# Patient Record
Sex: Male | Born: 1985 | Race: White | Hispanic: No | Marital: Single | Smoking: Current every day smoker
Health system: Southern US, Community
[De-identification: ages and names within clinical notes are randomized; demographics above are authoritative.]

## PROBLEM LIST (undated history)

## (undated) DIAGNOSIS — T300 Burn of unspecified body region, unspecified degree: Secondary | ICD-10-CM

## (undated) DIAGNOSIS — F1120 Opioid dependence, uncomplicated: Secondary | ICD-10-CM

## (undated) HISTORY — PX: SKIN GRAFT: SHX250

---

## 2011-08-17 ENCOUNTER — Encounter (HOSPITAL_COMMUNITY): Payer: Self-pay | Admitting: *Deleted

## 2011-08-17 ENCOUNTER — Emergency Department (HOSPITAL_COMMUNITY)
Admission: EM | Admit: 2011-08-17 | Discharge: 2011-08-17 | Discharge status: Against Medical Advice | Payer: Self-pay | Attending: Emergency Medicine | Admitting: Emergency Medicine

## 2011-08-17 DIAGNOSIS — F17200 Nicotine dependence, unspecified, uncomplicated: Secondary | ICD-10-CM | POA: Insufficient documentation

## 2011-08-17 DIAGNOSIS — Z046 Encounter for general psychiatric examination, requested by authority: Secondary | ICD-10-CM | POA: Insufficient documentation

## 2011-08-17 DIAGNOSIS — F1910 Other psychoactive substance abuse, uncomplicated: Secondary | ICD-10-CM | POA: Insufficient documentation

## 2011-08-17 LAB — COMPREHENSIVE METABOLIC PANEL
ALT: 19 U/L (ref 0–53)
Albumin: 3.6 g/dL (ref 3.5–5.2)
Alkaline Phosphatase: 61 U/L (ref 39–117)
Potassium: 3.7 mEq/L (ref 3.5–5.1)
Sodium: 138 mEq/L (ref 135–145)
Total Protein: 7.1 g/dL (ref 6.0–8.3)

## 2011-08-17 LAB — CBC WITH DIFFERENTIAL/PLATELET
Basophils Absolute: 0.1 10*3/uL (ref 0.0–0.1)
Eosinophils Absolute: 0.3 10*3/uL (ref 0.0–0.7)
HCT: 40.9 % (ref 39.0–52.0)
Lymphs Abs: 3.5 10*3/uL (ref 0.7–4.0)
MCH: 29.5 pg (ref 26.0–34.0)
MCHC: 34 g/dL (ref 30.0–36.0)
MCV: 86.8 fL (ref 78.0–100.0)
Monocytes Absolute: 0.8 10*3/uL (ref 0.1–1.0)
Neutro Abs: 3.1 10*3/uL (ref 1.7–7.7)
RDW: 13 % (ref 11.5–15.5)

## 2011-08-17 LAB — RAPID URINE DRUG SCREEN, HOSP PERFORMED
Barbiturates: POSITIVE — AB
Benzodiazepines: NOT DETECTED

## 2011-08-17 MED ORDER — NICOTINE 21 MG/24HR TD PT24
21.0000 mg | MEDICATED_PATCH | Freq: Every day | TRANSDERMAL | Status: DC
  Administered 2011-08-17: 21 mg via TRANSDERMAL
  Filled 2011-08-17: qty 1

## 2011-08-17 MED ORDER — ACETAMINOPHEN 325 MG PO TABS: 650.0000 mg | ORAL_TABLET | ORAL | Status: DC | PRN

## 2011-08-17 MED ORDER — CLONIDINE HCL 0.1 MG PO TABS: 0.1000 mg | ORAL_TABLET | Freq: Two times a day (BID) | ORAL | Status: DC | PRN

## 2011-08-17 MED ORDER — IBUPROFEN 400 MG PO TABS: 600.0000 mg | ORAL_TABLET | Freq: Three times a day (TID) | ORAL | Status: DC | PRN

## 2011-08-17 MED ORDER — ALUM & MAG HYDROXIDE-SIMETH 200-200-20 MG/5ML PO SUSP: 30.0000 mL | ORAL | Status: DC | PRN

## 2011-08-17 MED ORDER — ONDANSETRON HCL 8 MG PO TABS: 4.0000 mg | ORAL_TABLET | Freq: Three times a day (TID) | ORAL | Status: DC | PRN

## 2011-08-17 MED ORDER — LORAZEPAM 1 MG PO TABS: 1.0000 mg | ORAL_TABLET | Freq: Three times a day (TID) | ORAL | Status: DC | PRN

## 2011-08-17 NOTE — ED Provider Notes (Signed)
History     CSN: [id number]  Arrival date & time 08/17/11  4098   First MD Initiated Contact with Patient 08/17/11 [phone]      Chief Complaint  Patient presents with  . Medical Clearance    (Consider location/radiation/quality/duration/timing/severity/associated sxs/prior treatment) HPI History provided by patient. Here requesting detox from crack cocaine and heroin. He denies any alcohol use. He has been using cocaine for a long time but only recently started injecting heroin. No fevers chills. No arm pain or swelling. No suicidal or homicidal ideations. No hallucinations. Patient denies been through detox in the past and states he wants to stop drugs, feels like he hangs out with the wrong crowd.  History reviewed. No pertinent past medical history.  History reviewed. No pertinent past surgical history.  No family history on file.  History  Substance Use Topics  . Smoking status: Current Everyday Smoker -- 0.5 packs/day    Types: Cigarettes  . Smokeless tobacco: Not on file  . Alcohol Use: No      Review of Systems  Constitutional: Negative for fever and chills.  HENT: Negative for neck pain and neck stiffness.   Eyes: Negative for pain.  Respiratory: Negative for shortness of breath.   Cardiovascular: Negative for chest pain.  Gastrointestinal: Negative for abdominal pain.  Genitourinary: Negative for dysuria.  Musculoskeletal: Negative for back pain.  Skin: Negative for rash.  Neurological: Negative for headaches.  Psychiatric/Behavioral: Negative for suicidal ideas and self-injury.  All other systems reviewed and are negative.    Allergies  Codeine  Home Medications  No current outpatient prescriptions on file.  BP 116/51  Pulse 93  Temp 98.2 F (36.8 C) (Oral)  Resp 15  SpO2 97%  Physical Exam  Constitutional: He is oriented to person, place, and time. He appears well-developed and well-nourished.  HENT:  Head: Normocephalic and atraumatic.    Eyes: Conjunctivae and EOM are normal. Pupils are equal, round, and reactive to light.  Neck: Trachea normal. Neck supple. No thyromegaly present.  Cardiovascular: Normal rate, regular rhythm, S1 normal, S2 normal and normal pulses.     No systolic murmur is present   No diastolic murmur is present  Pulses:      Radial pulses are 2+ on the right side, and 2+ on the left side.  Pulmonary/Chest: Effort normal and breath sounds normal. He has no wheezes. He has no rhonchi. He has no rales. He exhibits no tenderness.  Abdominal: Soft. Normal appearance and bowel sounds are normal. There is no tenderness. There is no CVA tenderness and negative Murphy's sign.  Musculoskeletal: Normal range of motion. He exhibits no edema and no tenderness.       Normal gait  Neurological: He is alert and oriented to person, place, and time. He has normal strength. No cranial nerve deficit or sensory deficit. GCS eye subscore is 4. GCS verbal subscore is 5. GCS motor subscore is 6.  Skin: Skin is warm and dry. No rash noted. He is not diaphoretic.  Psychiatric: His speech is normal.       Cooperative and appropriate    ED Course  Procedures (including critical care time)  Results for orders placed during the hospital encounter of 08/17/11  COMPREHENSIVE METABOLIC PANEL      Component Value Range   Sodium 138  135 - 145 mEq/L   Potassium 3.7  3.5 - 5.1 mEq/L   Chloride 101  96 - 112 mEq/L   CO2 28  19 -  32 mEq/L   Glucose, Bld 114 (*) 70 - 99 mg/dL   BUN 12  6 - 23 mg/dL   Creatinine, Ser 1.61  0.50 - 1.35 mg/dL   Calcium 9.4  8.4 - 09.6 mg/dL   Total Protein 7.1  6.0 - 8.3 g/dL   Albumin 3.6  3.5 - 5.2 g/dL   AST 21  0 - 37 U/L   ALT 19  0 - 53 U/L   Alkaline Phosphatase 61  39 - 117 U/L   Total Bilirubin 0.1 (*) 0.3 - 1.2 mg/dL   GFR calc non Af Amer >90  >90 mL/min   GFR calc Af Amer >90  >90 mL/min  CBC WITH DIFFERENTIAL      Component Value Range   WBC 7.8  4.0 - 10.5 K/uL   RBC 4.71  4.22  - 5.81 MIL/uL   Hemoglobin 13.9  13.0 - 17.0 g/dL   HCT 04.5  40.9 - 81.1 %   MCV 86.8  78.0 - 100.0 fL   MCH 29.5  26.0 - 34.0 pg   MCHC 34.0  30.0 - 36.0 g/dL   RDW 91.4  78.2 - 95.6 %   Platelets 227  150 - 400 K/uL   Neutrophils Relative 40 (*) 43 - 77 %   Lymphocytes Relative 45  12 - 46 %   Monocytes Relative 10  3 - 12 %   Eosinophils Relative 4  0 - 5 %   Basophils Relative 1  0 - 1 %   Neutro Abs 3.1  1.7 - 7.7 K/uL   Lymphs Abs 3.5  0.7 - 4.0 K/uL   Monocytes Absolute 0.8  0.1 - 1.0 K/uL   Eosinophils Absolute 0.3  0.0 - 0.7 K/uL   Basophils Absolute 0.1  0.0 - 0.1 K/uL  ETHANOL      Component Value Range   Alcohol, Ethyl (B) <11  0 - 11 mg/dL   Case discussed with ACT team - will evaluate in ED for possible detox.   No indication for IVC or psychiatric admission. Vital signs in normal range  MDM   Polysubstance abuse requesting detox.        Sunnie Nielsen, MD 08/17/11 [phone]

## 2011-08-17 NOTE — ED Notes (Signed)
Patient requested to leave hospital AMA.

## 2011-08-17 NOTE — BH Assessment (Signed)
[facility] Assessment Progress Note      1145:  Screening at [facility] completed.  Faxed over vitals and labwork for review per request to [phone]. Awaiting disposition by [facility].

## 2011-08-17 NOTE — ED Notes (Signed)
Here for help with drug detox. Reports crack, cocaine & heroin use. Denies ETOH. Denies physical sx. Last use last night. Alert, NAD, calm, interactive.

## 2011-08-17 NOTE — ED Notes (Signed)
Irving Burton with ACT Team was made aware about the consult.

## 2011-08-17 NOTE — BH Assessment (Signed)
Assessment Note   Hunter Perry is an 26 y.o. male that presented to the ED requesting detox from Crack and Heroin.  Pt reports binging on Crack, up to 20 dollars QD for a month +.  He is also abusing Heroin and uses approximately 20 dollars QD for a month + as well.  Pt has not slept in over a day and is lethargic and groggy, but admits using until last night with no sleep and has lost almost 30 pounds in the last month as well.  Pt denies any prior treatment for substance abuse or mental health issues.  Pt currently resides with his mother, but it is questionable if he is able to return there.  Pt has 2 legal charges related to driving with a revoked license.  Pt denies SI, HI, or any active psychotic features.  Pt is able to contract for safety and will be referred to [facility] and [facility] for possible admission.    Axis I: Substance Abuse Axis II: Deferred Axis III: History reviewed. No pertinent past medical history. Axis IV: other psychosocial or environmental problems, problems related to legal system/crime, problems related to social environment and problems with primary support group Axis V: 31-40 impairment in reality testing  Past Medical History: History reviewed. No pertinent past medical history.  History reviewed. No pertinent past surgical history.  Family History: No family history on file.  Social History:  reports that he has been smoking Cigarettes.  He has been smoking about .5 packs per day. He does not have any smokeless tobacco history on file. He reports that he uses illicit drugs (IV and Cocaine). He reports that he does not drink alcohol.  Additional Social History:  Alcohol / Drug Use Pain Medications: No Prescriptions: No Over the Counter: No History of alcohol / drug use?: Yes Substance #1 Name of Substance 1: Crack 1 - Age of First Use: 17 1 - Amount (size/oz): 20 dollars 1 - Frequency: QD 1 - Duration: 1 month + 1 - Last Use / Amount: last night- 06/27 Substance  #2 Name of Substance 2: Heroin 2 - Age of First Use: 25` 2 - Amount (size/oz): 20 dollars  2 - Frequency: QD 2 - Duration: 1 month + 2 - Last Use / Amount: last night 06/27  CIWA: CIWA-Ar BP: 116/51 mmHg Pulse Rate: 93  Nausea and Vomiting: no nausea and no vomiting Tactile Disturbances: none Tremor: no tremor Auditory Disturbances: not present Paroxysmal Sweats: no sweat visible Visual Disturbances: not present Anxiety: mildly anxious Headache, Fullness in Head: none present Agitation: normal activity Orientation and Clouding of Sensorium: oriented and can do serial additions CIWA-Ar Total: 1  COWS: Clinical Opiate Withdrawal Scale (COWS) Resting Pulse Rate: Pulse Rate 81-100 Sweating: Subjective report of chills or flushing Restlessness: Able to sit still Pupil Size: Pupils possibly larger than normal for room light Bone or Joint Aches: Not present Runny Nose or Tearing: Not present GI Upset: No GI symptoms Tremor: Tremor can be felt, but not observed Yawning: No yawning Anxiety or Irritability: Patient reports increasing irritability or anxiousness Gooseflesh Skin: Skin is smooth COWS Total Score: 5   Allergies:  Allergies  Allergen Reactions  . Codeine Hives and Rash    Home Medications:  (Not in a hospital admission)  OB/GYN Status:  No LMP for male patient.  General Assessment Data Location of Assessment: [facility] ED Living Arrangements: Parent Can pt return to current living arrangement?: Yes Admission Status: Voluntary Is patient capable of signing voluntary  admission?: Yes Transfer from: Acute Hospital Referral Source: MD  Education Status Is patient currently in school?: No  Risk to self Suicidal Ideation: No Suicidal Intent: No Is patient at risk for suicide?: No Suicidal Plan?: No Access to Means: No What has been your use of drugs/alcohol within the last 12 months?: Cocaine and Heroin Previous Attempts/Gestures: No How many times?: 0  Other  Self Harm Risks: damaging, reckless Intentional Self Injurious Behavior: Damaging Family Suicide History: No Recent stressful life event(s): Legal Issues;Conflict (Comment) Persecutory voices/beliefs?: No Depression: No Substance abuse history and/or treatment for substance abuse?: Yes Suicide prevention information given to non-admitted patients: Not applicable  Risk to Others Homicidal Ideation: No Thoughts of Harm to Others: No Current Homicidal Intent: No Current Homicidal Plan: No Access to Homicidal Means: No Identified Victim: N/A History of harm to others?: No Assessment of Violence: None Noted Violent Behavior Description: N/A Does patient have access to weapons?: No Criminal Charges Pending?: Yes Describe Pending Criminal Charges: Driving While License Revoked Does patient have a court date: Yes Court Date: 08/29/11  Psychosis Hallucinations: None noted Delusions: None noted  Mental Status Report Appear/Hygiene: Disheveled;Body odor Eye Contact: Poor Motor Activity: Unremarkable Speech: Soft;Slow;Slurred Level of Consciousness: Drowsy;Irritable Mood: Labile;Irritable Affect: Anxious Anxiety Level: Minimal Thought Processes: Relevant Judgement: Unimpaired Orientation: Person;Place;Time;Situation Obsessive Compulsive Thoughts/Behaviors: Moderate  Cognitive Functioning Concentration: Decreased Memory: Recent Impaired;Remote Impaired IQ: Average Insight: Poor Impulse Control: Poor Appetite: Poor Weight Loss: 30  Weight Gain: 0  Sleep: Decreased Total Hours of Sleep: 3  Vegetative Symptoms: Decreased grooming;Not bathing  ADLScreening [facility] Assessment Services) Patient's cognitive ability adequate to safely complete daily activities?: Yes Patient able to express need for assistance with ADLs?: Yes Independently performs ADLs?: Yes  Abuse/Neglect [facility]) Physical Abuse: Denies Verbal Abuse: Denies Sexual Abuse: Denies  Prior Inpatient Therapy Prior  Inpatient Therapy: No Prior Therapy Dates: n/a Prior Therapy Facilty/Provider(s): n/a Reason for Treatment: n/a  Prior Outpatient Therapy Prior Outpatient Therapy: No Prior Therapy Dates: n/a Prior Therapy Facilty/Provider(s): n/a Reason for Treatment: n/a  ADL Screening (condition at time of admission) Patient's cognitive ability adequate to safely complete daily activities?: Yes Patient able to express need for assistance with ADLs?: Yes Independently performs ADLs?: Yes Weakness of Legs: None Weakness of Arms/Hands: None       Abuse/Neglect Assessment (Assessment to be complete while patient is alone) Physical Abuse: Denies Verbal Abuse: Denies Sexual Abuse: Denies Exploitation of patient/patient's resources: Denies Self-Neglect: Yes, present (Comment) (Pt reports not sleeping, eating, or grooming as he should ) Values / Beliefs Cultural Requests During Hospitalization: None Spiritual Requests During Hospitalization: None   Advance Directives (For Healthcare) Advance Directive: Patient does not have advance directive    Additional Information 1:1 In Past 12 Months?: No CIRT Risk: No Elopement Risk: No Does patient have medical clearance?: Yes     Disposition:  Disposition Disposition of Patient: Referred to Patient referred to: [facility]  On Site Evaluation by:   Reviewed with Physician:     Angelica Ran 08/17/2011 8:25 AM

## 2011-08-17 NOTE — ED Notes (Signed)
Breakfast tray ordered 

## 2011-08-17 NOTE — ED Notes (Signed)
Patient left AMA after signing AMA paper work.

## 2011-11-06 ENCOUNTER — Emergency Department (HOSPITAL_COMMUNITY)
Admission: EM | Admit: 2011-11-06 | Discharge: 2011-11-07 | Disposition: A | Discharge status: Home / Self Care | Payer: Self-pay | Attending: Emergency Medicine | Admitting: Emergency Medicine

## 2011-11-06 ENCOUNTER — Encounter (HOSPITAL_COMMUNITY): Payer: Self-pay

## 2011-11-06 DIAGNOSIS — F1110 Opioid abuse, uncomplicated: Secondary | ICD-10-CM | POA: Insufficient documentation

## 2011-11-06 DIAGNOSIS — F17200 Nicotine dependence, unspecified, uncomplicated: Secondary | ICD-10-CM | POA: Insufficient documentation

## 2011-11-06 HISTORY — DX: Burn of unspecified body region, unspecified degree: T30.0

## 2011-11-06 HISTORY — DX: Opioid dependence, uncomplicated: F11.20

## 2011-11-06 LAB — CBC
HCT: 45 % (ref 39.0–52.0)
Hemoglobin: 15.4 g/dL (ref 13.0–17.0)
MCH: 29.3 pg (ref 26.0–34.0)
MCHC: 34.2 g/dL (ref 30.0–36.0)
MCV: 85.6 fL (ref 78.0–100.0)
Platelets: 252 10*3/uL (ref 150–400)
RBC: 5.26 MIL/uL (ref 4.22–5.81)
RDW: 14.2 % (ref 11.5–15.5)
WBC: 9.3 10*3/uL (ref 4.0–10.5)

## 2011-11-06 LAB — COMPREHENSIVE METABOLIC PANEL
ALT: 1135 U/L — ABNORMAL HIGH (ref 0–53)
AST: 420 U/L — ABNORMAL HIGH (ref 0–37)
Albumin: 3.4 g/dL — ABNORMAL LOW (ref 3.5–5.2)
Alkaline Phosphatase: 107 U/L (ref 39–117)
BUN: 8 mg/dL (ref 6–23)
CO2: 24 mEq/L (ref 19–32)
Calcium: 9 mg/dL (ref 8.4–10.5)
Chloride: 102 mEq/L (ref 96–112)
Creatinine, Ser: 0.86 mg/dL (ref 0.50–1.35)
GFR calc Af Amer: >90 mL/min (ref >90)
GFR calc non Af Amer: >90 mL/min (ref >90)
Glucose, Bld: 100 mg/dL — ABNORMAL HIGH (ref 70–99)
Potassium: 3.8 mEq/L (ref 3.5–5.1)
Sodium: 136 mEq/L (ref 135–145)
Total Bilirubin: 0.8 mg/dL (ref 0.3–1.2)
Total Protein: 6.7 g/dL (ref 6.0–8.3)

## 2011-11-06 LAB — RAPID URINE DRUG SCREEN, HOSP PERFORMED
Amphetamines: NOT DETECTED
Barbiturates: POSITIVE — AB
Benzodiazepines: NOT DETECTED
Cocaine: POSITIVE — AB
Opiates: POSITIVE — AB
Tetrahydrocannabinol: NOT DETECTED

## 2011-11-06 LAB — ETHANOL: Alcohol, Ethyl (B): <11 mg/dL (ref 0–11)

## 2011-11-06 MED ORDER — NAPROXEN 500 MG PO TABS: 500.0000 mg | ORAL_TABLET | Freq: Two times a day (BID) | ORAL | Status: DC | PRN

## 2011-11-06 MED ORDER — IBUPROFEN 200 MG PO TABS: 600.0000 mg | ORAL_TABLET | Freq: Three times a day (TID) | ORAL | Status: DC | PRN

## 2011-11-06 MED ORDER — LORAZEPAM 1 MG PO TABS
1.0000 mg | ORAL_TABLET | Freq: Three times a day (TID) | ORAL | Status: DC | PRN
  Administered 2011-11-06 – 2011-11-07 (×2): 1 mg via ORAL
  Filled 2011-11-06 (×2): qty 1

## 2011-11-06 MED ORDER — ONDANSETRON 4 MG PO TBDP: 4.0000 mg | ORAL_TABLET | Freq: Four times a day (QID) | ORAL | Status: DC | PRN

## 2011-11-06 MED ORDER — CLONIDINE HCL 0.1 MG PO TABS: 0.1000 mg | ORAL_TABLET | ORAL | Status: DC

## 2011-11-06 MED ORDER — CLONIDINE HCL 0.1 MG PO TABS: 0.1000 mg | ORAL_TABLET | Freq: Every day | ORAL | Status: DC

## 2011-11-06 MED ORDER — METHOCARBAMOL 500 MG PO TABS
500.0000 mg | ORAL_TABLET | Freq: Three times a day (TID) | ORAL | Status: DC | PRN
  Administered 2011-11-06 – 2011-11-07 (×2): 500 mg via ORAL
  Filled 2011-11-06 (×2): qty 1

## 2011-11-06 MED ORDER — HYDROXYZINE HCL 25 MG PO TABS
25.0000 mg | ORAL_TABLET | Freq: Four times a day (QID) | ORAL | Status: DC | PRN
  Administered 2011-11-07: 25 mg via ORAL
  Filled 2011-11-06: qty 1

## 2011-11-06 MED ORDER — ALUM & MAG HYDROXIDE-SIMETH 200-200-20 MG/5ML PO SUSP: 30.0000 mL | ORAL | Status: DC | PRN

## 2011-11-06 MED ORDER — CLONIDINE HCL 0.1 MG PO TABS: 0.1000 mg | ORAL_TABLET | Freq: Four times a day (QID) | ORAL | Status: DC

## 2011-11-06 MED ORDER — ONDANSETRON HCL 4 MG PO TABS: 4.0000 mg | ORAL_TABLET | Freq: Three times a day (TID) | ORAL | Status: DC | PRN

## 2011-11-06 MED ORDER — LOPERAMIDE HCL 2 MG PO CAPS: 2.0000 mg | ORAL_CAPSULE | ORAL | Status: DC | PRN

## 2011-11-06 MED ORDER — ZOLPIDEM TARTRATE 5 MG PO TABS: 5.0000 mg | ORAL_TABLET | Freq: Every evening | ORAL | Status: DC | PRN

## 2011-11-06 MED ORDER — DICYCLOMINE HCL 20 MG PO TABS: 20.0000 mg | ORAL_TABLET | Freq: Four times a day (QID) | ORAL | Status: DC | PRN

## 2011-11-06 MED ORDER — ACETAMINOPHEN 325 MG PO TABS: 650.0000 mg | ORAL_TABLET | ORAL | Status: DC | PRN
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
## 2011-11-06 NOTE — ED Notes (Signed)
Pt in room making loud noises, into pt room, lying on bed with legs "jerking" stating, can't you give me something for my legs?"; pt informed will check to see what is available, pt verbalized understanding.

## 2011-11-06 NOTE — ED Notes (Signed)
Pt presents with no acute distress- Requesting detox from [facility]

## 2011-11-06 NOTE — ED Notes (Addendum)
Pt states "I tried to detox back in June off heroin but they weren't helping me"; when questioned who, pt stated "[place]"; pt also states my stomach and legs hurt, last used yesterday, been using since last December"; informed pt would check to see what is available, pt verbalized understanding.

## 2011-11-07 ENCOUNTER — Encounter (HOSPITAL_COMMUNITY): Payer: Self-pay | Admitting: *Deleted

## 2011-11-07 NOTE — B[facility] Counselor (Signed)
Beatrix Shipper, assessment counselor at [facility], submitted Pt for admission to [facility]. Akeysha McMurren, AC confirmed bed availability. Gave clinical report to Verne Spurr, PA who said [facility] physicians will need to review Pt's clinical criteria in more detail due to Pt's elevated liver enzymes. Communicated this information to [employer].  Harlin Rain Patsy [place], LPC

## 2011-11-07 NOTE — ED Provider Notes (Addendum)
Currently sleeping and without complaints. Awaiting placement for heroin detox.  Hunter Booze, MD 11/07/11 [phone]  Patient is now stating that he does not wish to stay because we are not doing anything for him. I reviewed his medications and he is not on a clonidine withdrawal protocol. I have informed patient of this and told him that I would be glad to put him on the clonidine withdrawal  protocol, but he states he does not want to stay in the does not want to go through detox. He is not homicidal or suicidal he is not hallucinating. I see no indication for keeping him in an involuntary basis, so he will be discharged with instructions to follow up with outpatient services.  Hunter Booze, MD 11/07/11 [phone]

## 2011-11-07 NOTE — ED Notes (Signed)
ACT team in to consult
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
## 2020-09-10 ENCOUNTER — Emergency Department (HOSPITAL_COMMUNITY): Payer: Self-pay

## 2020-09-10 ENCOUNTER — Encounter (HOSPITAL_COMMUNITY): Payer: Self-pay | Admitting: Pharmacy Technician

## 2020-09-10 ENCOUNTER — Inpatient Hospital Stay (HOSPITAL_COMMUNITY)
Admission: EM | Admit: 2020-09-10 | Discharge: 2020-09-12 | DRG: 142 | Disposition: A | Discharge status: Home / Self Care | Payer: Self-pay | Attending: Surgery | Admitting: Surgery

## 2020-09-10 DIAGNOSIS — S0993XA Unspecified injury of face, initial encounter: Secondary | ICD-10-CM | POA: Diagnosis present

## 2020-09-10 DIAGNOSIS — S0266XB Fracture of symphysis of mandible, initial encounter for open fracture: Secondary | ICD-10-CM | POA: Diagnosis present

## 2020-09-10 DIAGNOSIS — T07XXXA Unspecified multiple injuries, initial encounter: Secondary | ICD-10-CM

## 2020-09-10 DIAGNOSIS — S02602B Fracture of unspecified part of body of left mandible, initial encounter for open fracture: Principal | ICD-10-CM | POA: Diagnosis present

## 2020-09-10 DIAGNOSIS — T148XXA Other injury of unspecified body region, initial encounter: Secondary | ICD-10-CM | POA: Diagnosis present

## 2020-09-10 DIAGNOSIS — S02641A Fracture of ramus of right mandible, initial encounter for closed fracture: Secondary | ICD-10-CM | POA: Diagnosis present

## 2020-09-10 DIAGNOSIS — Z885 Allergy status to narcotic agent status: Secondary | ICD-10-CM

## 2020-09-10 DIAGNOSIS — S0083XA Contusion of other part of head, initial encounter: Secondary | ICD-10-CM | POA: Diagnosis present

## 2020-09-10 DIAGNOSIS — S02609B Fracture of mandible, unspecified, initial encounter for open fracture: Secondary | ICD-10-CM

## 2020-09-10 DIAGNOSIS — S31821A Laceration without foreign body of left buttock, initial encounter: Secondary | ICD-10-CM | POA: Diagnosis present

## 2020-09-10 DIAGNOSIS — Z20822 Contact with and (suspected) exposure to covid-19: Secondary | ICD-10-CM | POA: Diagnosis present

## 2020-09-10 DIAGNOSIS — F17210 Nicotine dependence, cigarettes, uncomplicated: Secondary | ICD-10-CM | POA: Diagnosis present

## 2020-09-10 LAB — HIV ANTIBODY (ROUTINE TESTING W REFLEX): HIV Screen 4th Generation wRfx: NONREACTIVE

## 2020-09-10 LAB — PROTIME-INR
INR: 1.1 (ref 0.8–1.2)
Prothrombin Time: 14.3 seconds (ref 11.4–15.2)

## 2020-09-10 LAB — COMPREHENSIVE METABOLIC PANEL
ALT: 88 U/L — ABNORMAL HIGH (ref 0–44)
AST: 65 U/L — ABNORMAL HIGH (ref 15–41)
Albumin: 3.3 g/dL — ABNORMAL LOW (ref 3.5–5.0)
Alkaline Phosphatase: 51 U/L (ref 38–126)
Anion gap: 4 — ABNORMAL LOW (ref 5–15)
BUN: 9 mg/dL (ref 6–20)
CO2: 25 mmol/L (ref 22–32)
Calcium: 8.1 mg/dL — ABNORMAL LOW (ref 8.9–10.3)
Chloride: 107 mmol/L (ref 98–111)
Creatinine, Ser: 0.87 mg/dL (ref 0.61–1.24)
GFR, Estimated: >60 mL/min (ref >60)
Glucose, Bld: 103 mg/dL — ABNORMAL HIGH (ref 70–99)
Potassium: 3.7 mmol/L (ref 3.5–5.1)
Sodium: 136 mmol/L (ref 135–145)
Total Bilirubin: 0.9 mg/dL (ref 0.3–1.2)
Total Protein: 6.2 g/dL — ABNORMAL LOW (ref 6.5–8.1)

## 2020-09-10 LAB — URINALYSIS, ROUTINE W REFLEX MICROSCOPIC
Bilirubin Urine: NEGATIVE
Glucose, UA: NEGATIVE mg/dL
Hgb urine dipstick: NEGATIVE
Ketones, ur: 5 mg/dL — AB
Leukocytes,Ua: NEGATIVE
Nitrite: NEGATIVE
Protein, ur: NEGATIVE mg/dL
Specific Gravity, Urine: 1.043 — ABNORMAL HIGH (ref 1.005–1.030)
pH: 6 (ref 5.0–8.0)

## 2020-09-10 LAB — I-STAT CHEM 8, ED
BUN: 9 mg/dL (ref 6–20)
Calcium, Ion: 1.07 mmol/L — ABNORMAL LOW (ref 1.15–1.40)
Chloride: 106 mmol/L (ref 98–111)
Creatinine, Ser: 0.8 mg/dL (ref 0.61–1.24)
Glucose, Bld: 99 mg/dL (ref 70–99)
HCT: 45 % (ref 39.0–52.0)
Hemoglobin: 15.3 g/dL (ref 13.0–17.0)
Potassium: 3.6 mmol/L (ref 3.5–5.1)
Sodium: 140 mmol/L (ref 135–145)
TCO2: 22 mmol/L (ref 22–32)

## 2020-09-10 LAB — RESP PANEL BY RT-PCR (FLU A&B, COVID) ARPGX2
Influenza A by PCR: NEGATIVE
Influenza B by PCR: NEGATIVE
SARS Coronavirus 2 by RT PCR: NEGATIVE

## 2020-09-10 LAB — CBC
HCT: 44.2 % (ref 39.0–52.0)
Hemoglobin: 14.6 g/dL (ref 13.0–17.0)
MCH: 30.5 pg (ref 26.0–34.0)
MCHC: 33 g/dL (ref 30.0–36.0)
MCV: 92.5 fL (ref 80.0–100.0)
Platelets: 247 10*3/uL (ref 150–400)
RBC: 4.78 MIL/uL (ref 4.22–5.81)
RDW: 13 % (ref 11.5–15.5)
WBC: 20.6 10*3/uL — ABNORMAL HIGH (ref 4.0–10.5)
nRBC: 0 % (ref 0.0–0.2)

## 2020-09-10 LAB — SAMPLE TO BLOOD BANK

## 2020-09-10 LAB — LACTIC ACID, PLASMA: Lactic Acid, Venous: 1 mmol/L (ref 0.5–1.9)

## 2020-09-10 LAB — ETHANOL: Alcohol, Ethyl (B): <10 mg/dL (ref <10)

## 2020-09-10 MED ORDER — CHLORHEXIDINE GLUCONATE 0.12 % MT SOLN
15.0000 mL | Freq: Two times a day (BID) | OROMUCOSAL | Status: DC
  Administered 2020-09-10 (×2): 15 mL via OROMUCOSAL
  Filled 2020-09-10 (×3): qty 15

## 2020-09-10 MED ORDER — ONDANSETRON HCL 4 MG/2ML IJ SOLN
4.0000 mg | INTRAMUSCULAR | Status: DC | PRN
  Administered 2020-09-10: 4 mg via INTRAVENOUS
  Filled 2020-09-10: qty 2

## 2020-09-10 MED ORDER — METHOCARBAMOL 1000 MG/10ML IJ SOLN
500.0000 mg | Freq: Four times a day (QID) | INTRAVENOUS | Status: DC | PRN
  Filled 2020-09-10: qty 5

## 2020-09-10 MED ORDER — SODIUM CHLORIDE 0.9 % IV SOLN
3.0000 g | Freq: Three times a day (TID) | INTRAVENOUS | Status: DC
  Administered 2020-09-10 – 2020-09-12 (×6): 3 g via INTRAVENOUS
  Filled 2020-09-10: qty 3
  Filled 2020-09-10: qty 8
  Filled 2020-09-10: qty 3
  Filled 2020-09-10 (×9): qty 8

## 2020-09-10 MED ORDER — KETOROLAC TROMETHAMINE 15 MG/ML IJ SOLN
15.0000 mg | Freq: Three times a day (TID) | INTRAMUSCULAR | Status: DC
  Administered 2020-09-10 – 2020-09-12 (×5): 15 mg via INTRAVENOUS
  Filled 2020-09-10 (×4): qty 1

## 2020-09-10 MED ORDER — ACETAMINOPHEN 325 MG PO TABS
650.0000 mg | ORAL_TABLET | Freq: Four times a day (QID) | ORAL | Status: DC
  Administered 2020-09-10 – 2020-09-11 (×3): 650 mg via ORAL
  Filled 2020-09-10 (×3): qty 2

## 2020-09-10 MED ORDER — FENTANYL CITRATE (PF) 100 MCG/2ML IJ SOLN
50.0000 ug | INTRAMUSCULAR | Status: DC | PRN
  Administered 2020-09-10 (×2): 50 ug via INTRAVENOUS
  Filled 2020-09-10 (×2): qty 2

## 2020-09-10 MED ORDER — CEFAZOLIN SODIUM-DEXTROSE 2-4 GM/100ML-% IV SOLN
2.0000 g | Freq: Once | INTRAVENOUS | Status: AC
  Administered 2020-09-10: 2 g via INTRAVENOUS
  Filled 2020-09-10: qty 100

## 2020-09-10 MED ORDER — LIDOCAINE-EPINEPHRINE 1 %-1:100000 IJ SOLN
20.0000 mL | Freq: Once | INTRAMUSCULAR | Status: AC
  Administered 2020-09-10: 1 mL
  Filled 2020-09-10: qty 1

## 2020-09-10 MED ORDER — ONDANSETRON HCL 4 MG/2ML IJ SOLN: 4.0000 mg | Freq: Four times a day (QID) | INTRAMUSCULAR | Status: DC | PRN

## 2020-09-10 MED ORDER — ENOXAPARIN SODIUM 30 MG/0.3ML IJ SOSY: 30.0000 mg | PREFILLED_SYRINGE | Freq: Two times a day (BID) | INTRAMUSCULAR | Status: DC

## 2020-09-10 MED ORDER — LACTATED RINGERS IV SOLN: INTRAVENOUS | Status: DC

## 2020-09-10 MED ORDER — OXYCODONE HCL 5 MG PO TABS: 5.0000 mg | ORAL_TABLET | ORAL | Status: DC | PRN

## 2020-09-10 MED ORDER — SODIUM CHLORIDE 0.9 % IV SOLN: 3.0000 g | INTRAVENOUS | Status: DC

## 2020-09-10 MED ORDER — HYDROMORPHONE HCL 1 MG/ML IJ SOLN: 0.5000 mg | INTRAMUSCULAR | Status: DC | PRN

## 2020-09-10 MED ORDER — ONDANSETRON 4 MG PO TBDP: 4.0000 mg | ORAL_TABLET | Freq: Four times a day (QID) | ORAL | Status: DC | PRN

## 2020-09-10 MED ORDER — IOHEXOL 300 MG/ML  SOLN
100.0000 mL | Freq: Once | INTRAMUSCULAR | Status: AC | PRN
  Administered 2020-09-10: 100 mL via INTRAVENOUS

## 2020-09-10 MED ORDER — OXYCODONE HCL 5 MG PO TABS
10.0000 mg | ORAL_TABLET | ORAL | Status: DC | PRN
Start: 2020-09-10 — End: 2020-09-12
  Administered 2020-09-10 – 2020-09-11 (×2): 10 mg via ORAL
  Filled 2020-09-10 (×2): qty 2

## 2020-09-10 MED ORDER — GABAPENTIN 300 MG PO CAPS
300.0000 mg | ORAL_CAPSULE | Freq: Three times a day (TID) | ORAL | Status: DC
  Administered 2020-09-10 (×2): 300 mg via ORAL
  Filled 2020-09-10 (×3): qty 1

## 2020-09-10 NOTE — ED Notes (Signed)
Family at bedside. 

## 2020-09-10 NOTE — ED Provider Notes (Signed)
Laceration to L hip repaired by me per request from Dr. Donnald Garre.   Marland Kitchen.Laceration Repair  Date/Time: 09/10/2020 2:54 PM Performed by: Fayrene Helper, PA-C Authorized by: Fayrene Helper, PA-C   Consent:    Consent obtained:  Verbal   Consent given by:  Patient and parent   Risks discussed:  Infection, need for additional repair, pain, poor cosmetic result and poor wound healing   Alternatives discussed:  No treatment and delayed treatment Universal protocol:    Procedure explained and questions answered to patient or proxy's satisfaction: yes     Relevant documents present and verified: yes     Test results available: yes     Imaging studies available: yes     Required blood products, implants, devices, and special equipment available: yes     Site/side marked: yes     Immediately prior to procedure, a time out was called: yes     Patient identity confirmed:  Verbally with patient Anesthesia:    Anesthesia method:  Local infiltration   Local anesthetic:  Lidocaine 2% WITH epi Laceration details:    Location:  Pelvis   Pelvis location:  L hip   Length (cm):  3   Depth (mm):  12 Pre-procedure details:    Preparation:  Patient was prepped and draped in usual sterile fashion and imaging obtained to evaluate for foreign bodies Exploration:    Limited defect created (wound extended): no     Hemostasis achieved with:  Direct pressure   Imaging outcome: foreign body not noted     Wound extent: muscle damage and vascular damage     Contaminated: no   Treatment:    Area cleansed with:  Saline   Amount of cleaning:  Standard   Irrigation solution:  Sterile saline   Irrigation method:  Pressure wash Skin repair:    Repair method:  Staples   Number of staples:  4 Approximation:    Approximation:  Loose Repair type:    Repair type:  Simple Post-procedure details:    Dressing:  Non-adherent dressing   Procedure completion:  Tolerated with difficulty    Fayrene Helper, PA-C 09/10/20  [id number]    Arby Barrette, MD 09/15/20 [phone]

## 2020-09-10 NOTE — ED Notes (Signed)
Pt refusing covid swab
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
## 2020-09-10 NOTE — ED Notes (Signed)
Attempted report 

## 2020-09-10 NOTE — ED Notes (Signed)
Patient transported to CT 

## 2020-09-10 NOTE — ED Notes (Signed)
Blood draw unsuccessful 
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
## 2020-09-11 ENCOUNTER — Encounter (HOSPITAL_COMMUNITY): Payer: Self-pay

## 2020-09-11 ENCOUNTER — Observation Stay (HOSPITAL_COMMUNITY): Payer: Self-pay | Admitting: Certified Registered Nurse Anesthetist

## 2020-09-11 ENCOUNTER — Encounter (HOSPITAL_COMMUNITY): Admission: EM | Disposition: A | Discharge status: Home / Self Care | Payer: Self-pay | Source: Home / Self Care

## 2020-09-11 DIAGNOSIS — S0993XA Unspecified injury of face, initial encounter: Secondary | ICD-10-CM | POA: Diagnosis present

## 2020-09-11 HISTORY — PX: MINOR REMOVAL OF MANDIBULAR HARDWARE: SHX6427

## 2020-09-11 LAB — CBC
HCT: 33.6 % — ABNORMAL LOW (ref 39.0–52.0)
Hemoglobin: 11.5 g/dL — ABNORMAL LOW (ref 13.0–17.0)
MCH: 31.1 pg (ref 26.0–34.0)
MCHC: 34.2 g/dL (ref 30.0–36.0)
MCV: 90.8 fL (ref 80.0–100.0)
Platelets: 217 10*3/uL (ref 150–400)
RBC: 3.7 MIL/uL — ABNORMAL LOW (ref 4.22–5.81)
RDW: 12.9 % (ref 11.5–15.5)
WBC: 11.9 10*3/uL — ABNORMAL HIGH (ref 4.0–10.5)
nRBC: 0 % (ref 0.0–0.2)

## 2020-09-11 LAB — BASIC METABOLIC PANEL
Anion gap: 7 (ref 5–15)
BUN: 6 mg/dL (ref 6–20)
CO2: 26 mmol/L (ref 22–32)
Calcium: 8.2 mg/dL — ABNORMAL LOW (ref 8.9–10.3)
Chloride: 101 mmol/L (ref 98–111)
Creatinine, Ser: 0.88 mg/dL (ref 0.61–1.24)
GFR, Estimated: >60 mL/min (ref >60)
Glucose, Bld: 96 mg/dL (ref 70–99)
Potassium: 3.9 mmol/L (ref 3.5–5.1)
Sodium: 134 mmol/L — ABNORMAL LOW (ref 135–145)

## 2020-09-11 LAB — HEMOGLOBIN AND HEMATOCRIT, BLOOD
HCT: 34.9 % — ABNORMAL LOW (ref 39.0–52.0)
Hemoglobin: 11.6 g/dL — ABNORMAL LOW (ref 13.0–17.0)

## 2020-09-11 SURGERY — MINOR REMOVAL OF MANDIBULAR HARDWARE
Anesthesia: General

## 2020-09-11 MED ORDER — MIDAZOLAM HCL 2 MG/2ML IJ SOLN
INTRAMUSCULAR | Status: AC
  Filled 2020-09-11: qty 2

## 2020-09-11 MED ORDER — OXYMETAZOLINE HCL 0.05 % NA SOLN
NASAL | Status: AC
  Filled 2020-09-11: qty 30

## 2020-09-11 MED ORDER — KETAMINE HCL 50 MG/5ML IJ SOSY
PREFILLED_SYRINGE | INTRAMUSCULAR | Status: AC
  Filled 2020-09-11: qty 5

## 2020-09-11 MED ORDER — SUGAMMADEX SODIUM 200 MG/2ML IV SOLN
INTRAVENOUS | Status: DC | PRN
  Administered 2020-09-11: 200 mg via INTRAVENOUS

## 2020-09-11 MED ORDER — OXYMETAZOLINE HCL 0.05 % NA SOLN
NASAL | Status: DC | PRN
  Administered 2020-09-11: 2 via NASAL

## 2020-09-11 MED ORDER — FENTANYL CITRATE (PF) 250 MCG/5ML IJ SOLN
INTRAMUSCULAR | Status: DC | PRN
  Administered 2020-09-11: 100 ug via INTRAVENOUS
  Administered 2020-09-11: 150 ug via INTRAVENOUS

## 2020-09-11 MED ORDER — OXYCODONE HCL 5 MG/5ML PO SOLN
5.0000 mg | Freq: Once | ORAL | Status: DC | PRN
Start: 2020-09-11 — End: 2020-09-11

## 2020-09-11 MED ORDER — HYDROMORPHONE HCL 1 MG/ML IJ SOLN: 0.2500 mg | INTRAMUSCULAR | Status: DC | PRN

## 2020-09-11 MED ORDER — AMISULPRIDE (ANTIEMETIC) 5 MG/2ML IV SOLN: 10.0000 mg | Freq: Once | INTRAVENOUS | Status: DC | PRN

## 2020-09-11 MED ORDER — ONDANSETRON HCL 4 MG/2ML IJ SOLN
INTRAMUSCULAR | Status: AC
  Filled 2020-09-11: qty 2

## 2020-09-11 MED ORDER — PROPOFOL 10 MG/ML IV BOLUS
INTRAVENOUS | Status: AC
  Filled 2020-09-11: qty 20

## 2020-09-11 MED ORDER — CHLORHEXIDINE GLUCONATE 0.12 % MT SOLN: 15.0000 mL | Freq: Once | OROMUCOSAL | Status: DC

## 2020-09-11 MED ORDER — LACTATED RINGERS IV SOLN: INTRAVENOUS | Status: DC

## 2020-09-11 MED ORDER — LIDOCAINE 2% (20 MG/ML) 5 ML SYRINGE
INTRAMUSCULAR | Status: DC | PRN
  Administered 2020-09-11: 60 mg via INTRAVENOUS

## 2020-09-11 MED ORDER — DEXAMETHASONE SODIUM PHOSPHATE 10 MG/ML IJ SOLN
INTRAMUSCULAR | Status: DC | PRN
  Administered 2020-09-11: 10 mg via INTRAVENOUS

## 2020-09-11 MED ORDER — KETAMINE HCL 10 MG/ML IJ SOLN
INTRAMUSCULAR | Status: DC | PRN
  Administered 2020-09-11 (×2): 10 mg via INTRAVENOUS
  Administered 2020-09-11: 20 mg via INTRAVENOUS

## 2020-09-11 MED ORDER — KETOROLAC TROMETHAMINE 30 MG/ML IJ SOLN: 30.0000 mg | Freq: Once | INTRAMUSCULAR | Status: DC | PRN

## 2020-09-11 MED ORDER — FENTANYL CITRATE (PF) 250 MCG/5ML IJ SOLN
INTRAMUSCULAR | Status: AC
  Filled 2020-09-11: qty 5

## 2020-09-11 MED ORDER — OXYMETAZOLINE HCL 0.05 % NA SOLN
NASAL | Status: DC | PRN
  Administered 2020-09-11: 1

## 2020-09-11 MED ORDER — PROPOFOL 10 MG/ML IV BOLUS
INTRAVENOUS | Status: DC | PRN
  Administered 2020-09-11: 150 mg via INTRAVENOUS

## 2020-09-11 MED ORDER — ONDANSETRON HCL 4 MG/2ML IJ SOLN
INTRAMUSCULAR | Status: DC | PRN
  Administered 2020-09-11: 4 mg via INTRAVENOUS

## 2020-09-11 MED ORDER — SUCCINYLCHOLINE CHLORIDE 200 MG/10ML IV SOSY
PREFILLED_SYRINGE | INTRAVENOUS | Status: DC | PRN
  Administered 2020-09-11: 80 mg via INTRAVENOUS

## 2020-09-11 MED ORDER — LIDOCAINE-EPINEPHRINE 1 %-1:100000 IJ SOLN
INTRAMUSCULAR | Status: DC | PRN
  Administered 2020-09-11: 20 mL

## 2020-09-11 MED ORDER — ORAL CARE MOUTH RINSE: 15.0000 mL | Freq: Once | OROMUCOSAL | Status: DC

## 2020-09-11 MED ORDER — PHENYLEPHRINE 40 MCG/ML (10ML) SYRINGE FOR IV PUSH (FOR BLOOD PRESSURE SUPPORT)
PREFILLED_SYRINGE | INTRAVENOUS | Status: DC | PRN
  Administered 2020-09-11 (×5): 120 ug via INTRAVENOUS
  Administered 2020-09-11: 40 ug via INTRAVENOUS

## 2020-09-11 MED ORDER — ROCURONIUM BROMIDE 10 MG/ML (PF) SYRINGE
PREFILLED_SYRINGE | INTRAVENOUS | Status: DC | PRN
  Administered 2020-09-11: 50 mg via INTRAVENOUS

## 2020-09-11 MED ORDER — LIDOCAINE-EPINEPHRINE 1 %-1:100000 IJ SOLN
INTRAMUSCULAR | Status: AC
  Filled 2020-09-11: qty 1

## 2020-09-11 MED ORDER — PROMETHAZINE HCL 25 MG/ML IJ SOLN: 6.2500 mg | INTRAMUSCULAR | Status: DC | PRN

## 2020-09-11 MED ORDER — 0.9 % SODIUM CHLORIDE (POUR BTL) OPTIME
TOPICAL | Status: DC | PRN
  Administered 2020-09-11: 1000 mL

## 2020-09-11 MED ORDER — PHENYLEPHRINE 40 MCG/ML (10ML) SYRINGE FOR IV PUSH (FOR BLOOD PRESSURE SUPPORT)
PREFILLED_SYRINGE | INTRAVENOUS | Status: AC
  Filled 2020-09-11: qty 10

## 2020-09-11 MED ORDER — OXYCODONE HCL 5 MG PO TABS: 5.0000 mg | ORAL_TABLET | Freq: Once | ORAL | Status: DC | PRN

## 2020-09-11 SURGICAL SUPPLY — 65 items
BAG COUNTER SPONGE SURGICOUNT (BAG) ×2 IMPLANT
BIT DRILL 1.5X50 7MMSTP W/NTCH (BIT) IMPLANT
BIT DRILL 1.6X50 (BIT) ×1 IMPLANT
BLADE RECIPRO TAPERED (BLADE) IMPLANT
BLADE SURG 10 STRL SS (BLADE) IMPLANT
BLADE SURG 15 STRL LF DISP TIS (BLADE) IMPLANT
BLADE SURG 15 STRL SS (BLADE) ×1
BUR RND DIAMOND ELITE 3.5 (BURR) IMPLANT
BUR SURG 4X8 MED (BURR) IMPLANT
BURR SURG 4X8 MED (BURR)
CANISTER SUCT 3000ML PPV (MISCELLANEOUS) ×2 IMPLANT
CLEANER TIP ELECTROSURG 2X2 (MISCELLANEOUS) ×2 IMPLANT
COVER SURGICAL LIGHT HANDLE (MISCELLANEOUS) ×2 IMPLANT
DECANTER SPIKE VIAL GLASS SM (MISCELLANEOUS) ×1 IMPLANT
DRAPE HALF SHEET 40X57 (DRAPES) ×1 IMPLANT
DRILL 1.5X50 7MM STP W/NOTCH (BIT) ×2
ELECT COATED BLADE 2.86 ST (ELECTRODE) ×2 IMPLANT
ELECT NDL BLADE 2-5/6 (NEEDLE) IMPLANT
ELECT NEEDLE BLADE 2-5/6 (NEEDLE) IMPLANT
ELECT REM PT RETURN 9FT ADLT (ELECTROSURGICAL) ×2
ELECTRODE REM PT RTRN 9FT ADLT (ELECTROSURGICAL) ×1 IMPLANT
GLOVE SRG 8 PF TXTR STRL LF DI (GLOVE) ×1 IMPLANT
GLOVE SURG ENC MOIS LTX SZ8 (GLOVE) ×2 IMPLANT
GLOVE SURG UNDER POLY LF SZ8 (GLOVE) ×1
GOWN STRL REUS W/ TWL LRG LVL3 (GOWN DISPOSABLE) ×1 IMPLANT
GOWN STRL REUS W/ TWL XL LVL3 (GOWN DISPOSABLE) ×1 IMPLANT
GOWN STRL REUS W/TWL LRG LVL3 (GOWN DISPOSABLE) ×1
GOWN STRL REUS W/TWL XL LVL3 (GOWN DISPOSABLE) ×1
KIT BASIN OR (CUSTOM PROCEDURE TRAY) ×2 IMPLANT
KIT TURNOVER KIT B (KITS) ×2 IMPLANT
NDL HYPO 25GX1X1/2 BEV (NEEDLE) ×1 IMPLANT
NEEDLE HYPO 25GX1X1/2 BEV (NEEDLE) ×2 IMPLANT
NS IRRIG 1000ML POUR BTL (IV SOLUTION) ×2 IMPLANT
PAD ARMBOARD 7.5X6 YLW CONV (MISCELLANEOUS) ×4 IMPLANT
PATTIES SURGICAL .5 X3 (DISPOSABLE) IMPLANT
PENCIL BUTTON HOLSTER BLD 10FT (ELECTRODE) ×2 IMPLANT
PLATE LOCK 7H 1.6 GOLD GRD 3 (Plate) ×1 IMPLANT
PLATE LOCK STRT 4H 1/SCREW (Plate) ×1 IMPLANT
POSITIONER HEAD DONUT 9IN (MISCELLANEOUS) ×2 IMPLANT
PROTECTOR CORNEAL (OPHTHALMIC RELATED) IMPLANT
SCISSORS WIRE ANG 4 3/4 DISP (INSTRUMENTS) ×1 IMPLANT
SCREW LOCKING X-DR 2.0X10 (Screw) ×4 IMPLANT
SCREW LOCKING X-DR 2.0X6 (Screw) ×1 IMPLANT
SCREW LOCKING X-DR 2.0X8 (Screw) ×1 IMPLANT
SCREW LOCKING X-DR 2.3X10 (Screw) ×1 IMPLANT
SCREW LOCKING X-DR 2.3X8 (Screw) ×1 IMPLANT
SCREW NON LOCK X-DR 2.0X5 (Screw) ×4 IMPLANT
SUT CHROMIC 3 0 PS 2 (SUTURE) IMPLANT
SUT CHROMIC 4 0 PS 2 18 (SUTURE) IMPLANT
SUT ETHILON 5 0 P 3 18 (SUTURE)
SUT MNCRL AB 3-0 PS2 18 (SUTURE) ×1 IMPLANT
SUT MNCRL AB 4-0 PS2 18 (SUTURE) IMPLANT
SUT NYLON ETHILON 5-0 P-3 1X18 (SUTURE) IMPLANT
SUT PROLENE 5 0 PS 2 (SUTURE) ×1 IMPLANT
SUT SILK 2 0 PERMA HAND 18 BK (SUTURE) IMPLANT
SUT STEEL 0 (SUTURE)
SUT STEEL 0 18XMFL TIE 17 (SUTURE) IMPLANT
SUT STEEL 4 (SUTURE) IMPLANT
SUT VIC AB 3-0 SH 27 (SUTURE) ×1
SUT VIC AB 3-0 SH 27X BRD (SUTURE) ×1 IMPLANT
SUT VICRYL 4-0 PS2 18IN ABS (SUTURE) ×2 IMPLANT
TOWEL GREEN STERILE FF (TOWEL DISPOSABLE) ×2 IMPLANT
TRAY ENT MC OR (CUSTOM PROCEDURE TRAY) ×2 IMPLANT
WATER STERILE IRR 1000ML POUR (IV SOLUTION) ×2 IMPLANT
WIRE 24 GAUGE OMINIMAX MMF (WIRE) ×1 IMPLANT

## 2020-09-11 NOTE — Progress Notes (Signed)
Patient returned from OR. Jaw is wired shut and wire cutter is taped to wall behind bed. VS are WNL. CHG bath given. Diet order upgraded to full liquid. Patient has become very agitated because he can't swallow anything thicker than broth. Stating he wants to go home. Emotional support given. Will attempt to help patient swallow some liquids again in a bit. Brynda Rim, RN

## 2020-09-11 NOTE — Anesthesia Preprocedure Evaluation (Addendum)
Anesthesia Evaluation  Patient identified by MRN, date of birth, ID bandGeneral Assessment Comment:Patient responds to tactile stimulation   Reviewed: Allergy & Precautions, NPO status , Patient's Chart, lab work & pertinent test results  Airway Mallampati: III  TM Distance: >3 FB Neck ROM: Full  Mouth opening: Limited Mouth Opening  Dental no notable dental hx.    Pulmonary Current Smoker and Patient abstained from smoking.,    Pulmonary exam normal breath sounds clear to auscultation       Cardiovascular negative cardio ROS Normal cardiovascular exam Rhythm:Regular Rate:Normal     Neuro/Psych negative neurological ROS  negative psych ROS   GI/Hepatic negative GI ROS, (+)     substance abuse  ,   Endo/Other  negative endocrine ROS  Renal/GU negative Renal ROS     Musculoskeletal negative musculoskeletal ROS (+)   Abdominal   Peds  Hematology  (+) anemia ,   Anesthesia Other Findings Mandibular Fracture  Reproductive/Obstetrics                            Anesthesia Physical Anesthesia Plan  ASA: 3  Anesthesia Plan: General   Post-op Pain Management:    Induction: Intravenous  PONV Risk Score and Plan: 1 and Ondansetron, Dexamethasone, Midazolam and Treatment may vary due to age or medical condition  Airway Management Planned: Nasal ETT  Additional Equipment:   Intra-op Plan:   Post-operative Plan: Extubation in OR  Informed Consent: I have reviewed the patients History and Physical, chart, labs and discussed the procedure including the risks, benefits and alternatives for the proposed anesthesia with the patient or authorized representative who has indicated his/her understanding and acceptance.     Dental advisory given  Plan Discussed with: CRNA  Anesthesia Plan Comments: (Ok to remove cervical collar per trauma surgery)       Anesthesia Quick Evaluation

## 2020-09-11 NOTE — Op Note (Addendum)
Mandible Fracture Repair   Procedure Performed Open reduction internal fixation of left parasymphysis fracture, with MMF (62947) Closed reduction of right ramal fracture with MMF (65465)   Description of Operation/Procedure:   The patient was encountered in MOR Room 9.  General anesthesia was induced and a nasal endotracheal tube was secured in the standard fashion, taking care to avoid pressure on the ala of the nose.  The table was turned 90 degrees and the patient was properly padded, relieving all pressure points.  A formal time-out was executed.  2% lidocaine with 1:100,000 epinephrine was infiltrated into the proposed surgical sites.  The patient was prepped and draped in the standard sterile fashion and a throat pack was placed.   The fracture was reapproximated with a bridal wire. Upper and lower arch bars were cut to length and secured in the standard fashion with .018" circumdental wires.  The occlusion was then reduced.  The parasymphysis/body fracture was then exposed utilizing a vestibular incision, taking care to avoid injuring the mental nerve.  Subperiosteal dissection was used to expose the area and include the inferior border.  The mental nerve was identified and protected.  The fracture was found to be reducible.   The throat pack was removed. IMF was then secured with .018" fish wires.  The parasymphysis/body fracture was then reduced using bone reduction forceps.  A Biomet 1 mm thick 4 hole plate was fashioned to fit along the superior border of the mandible.  The plate was secured utilizing 2.0 mm diameter monocortical screws.  A Biomet 1.6 mm thick 7 hole plate was fashioned to fit along the inferior border of the mandible.  The plate was secured utilizing 2.0 and 2.3 mm diameter bicortical screws.  The right ramal fracture was treated in a closed fashion by means of intermaxillary fixation.  The operative sites were thoroughly irrigated and the incisions were closed in continuous  fashion with 3-0 Monocryl, taking care to reapproximate the mentalis muscle as necessary.  The teeth were brushed.  The oral cavity was suctioned free of all debris and secretions.  Gastric contents suctioned utilizing an OG tube. Sponge and needle counts were correct x 2.  Care of the patient was turned over to the Anesthesia team for uneventful extubation and delivery of the patient to the PACU in stable condition.   Plan: -We will plan for 4-6 weeks of MMF on liquid/pureed diet; wire cutters to be present with patient at all times for medical emergencies only -Return to floor for post-op pain management, IV antibiotics, and observation. -Post-op orthopantomogram prior to discharge if patient is able to (if not will obtain at follow up in clinic); okay to d/c from OMFS perspective -Antibiotic coverage for 7 days post-op (Liquid amoxicillin 500 mg TID) -Chlorohexidine mouthrinse BID  Herbie Saxon, DDS Oral and Maxillofacial Surgeon [facility]) Office # [phone] Cell # [phone]

## 2020-09-11 NOTE — Anesthesia Postprocedure Evaluation (Signed)
Anesthesia Post Note  Patient: Hunter Perry  Procedure(s) Performed: MANDIBULAR REPAIR     Patient location during evaluation: PACU Anesthesia Type: General Level of consciousness: awake Pain management: pain level controlled Vital Signs Assessment: post-procedure vital signs reviewed and stable Respiratory status: spontaneous breathing, nonlabored ventilation, respiratory function stable and patient connected to nasal cannula oxygen Cardiovascular status: blood pressure returned to baseline and stable Postop Assessment: no apparent nausea or vomiting Anesthetic complications: no   No notable events documented.  Last Vitals:  Vitals:   09/11/20 1251 09/11/20 1335  BP: (!) 141/84 (!) 142/82  Pulse: 82 76  Resp: 14 20  Temp:  36.9 C  SpO2: 97% 99%    Last Pain:  Vitals:   09/11/20 1335  TempSrc: Axillary  PainSc:                  Catheryn Bacon Gaige Sebo
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
## 2020-09-11 NOTE — Anesthesia Procedure Notes (Signed)
Procedure Name: Intubation Date/Time: 09/11/2020 7:45 AM Performed by: Epifanio Lesches, CRNA Pre-anesthesia Checklist: Patient identified, Emergency Drugs available, Suction available and Patient being monitored Patient Re-evaluated:Patient Re-evaluated prior to induction Oxygen Delivery Method: Circle System Utilized Preoxygenation: Pre-oxygenation with 100% oxygen Induction Type: IV induction Ventilation: Mask ventilation without difficulty Laryngoscope Size: Glidescope and 4 Grade View: Grade I Nasal Tubes: Nasal Rae, Nasal prep performed and Right Tube size: 7.0 mm Number of attempts: 1 Placement Confirmation: ETT inserted through vocal cords under direct vision, positive ETCO2 and breath sounds checked- equal and bilateral Secured at: 28 cm Tube secured with: Tape Dental Injury: Teeth and Oropharynx as per pre-operative assessment

## 2020-09-11 NOTE — Transfer of Care (Signed)
Immediate Anesthesia Transfer of Care Note  Patient: Hunter Perry  Procedure(s) Performed: MANDIBULAR REPAIR  Patient Location: PACU  Anesthesia Type:General  Level of Consciousness: awake and drowsy  Airway & Oxygen Therapy: Patient Spontanous Breathing and Patient connected to face mask oxygen  Post-op Assessment: Report given to RN and Post -op Vital signs reviewed and stable  Post vital signs: Reviewed and stable  Last Vitals:  Vitals Value Taken Time  BP 141/84 09/11/20 1251  Temp 36.9 C 09/11/20 1106  Pulse 81 09/11/20 1252  Resp 13 09/11/20 1252  SpO2 98 % 09/11/20 1252  Vitals shown include unvalidated device data.  Last Pain:  Vitals:   09/11/20 1236  TempSrc:   PainSc: 0-No pain         Complications: No notable events documented.

## 2020-09-12 ENCOUNTER — Encounter (HOSPITAL_COMMUNITY): Payer: Self-pay | Admitting: Oral Surgery

## 2020-09-12 DIAGNOSIS — S0266XB Fracture of symphysis of mandible, initial encounter for open fracture: Secondary | ICD-10-CM | POA: Diagnosis present

## 2020-09-12 DIAGNOSIS — S31821A Laceration without foreign body of left buttock, initial encounter: Secondary | ICD-10-CM | POA: Diagnosis present

## 2020-09-12 LAB — CBC
HCT: 30.3 % — ABNORMAL LOW (ref 39.0–52.0)
Hemoglobin: 10.2 g/dL — ABNORMAL LOW (ref 13.0–17.0)
MCH: 30.9 pg (ref 26.0–34.0)
MCHC: 33.7 g/dL (ref 30.0–36.0)
MCV: 91.8 fL (ref 80.0–100.0)
Platelets: 221 10*3/uL (ref 150–400)
RBC: 3.3 MIL/uL — ABNORMAL LOW (ref 4.22–5.81)
RDW: 12.8 % (ref 11.5–15.5)
WBC: 17.4 10*3/uL — ABNORMAL HIGH (ref 4.0–10.5)
nRBC: 0 % (ref 0.0–0.2)

## 2020-09-12 LAB — BASIC METABOLIC PANEL
Anion gap: 6 (ref 5–15)
BUN: 7 mg/dL (ref 6–20)
CO2: 24 mmol/L (ref 22–32)
Calcium: 8.1 mg/dL — ABNORMAL LOW (ref 8.9–10.3)
Chloride: 104 mmol/L (ref 98–111)
Creatinine, Ser: 0.78 mg/dL (ref 0.61–1.24)
GFR, Estimated: >60 mL/min (ref >60)
Glucose, Bld: 119 mg/dL — ABNORMAL HIGH (ref 70–99)
Potassium: 4.7 mmol/L (ref 3.5–5.1)
Sodium: 134 mmol/L — ABNORMAL LOW (ref 135–145)

## 2020-09-12 MED ORDER — OXYCODONE-ACETAMINOPHEN 5-325 MG/5ML PO SOLN: 5.0000 mL | Freq: Four times a day (QID) | ORAL | 0 refills | Status: AC | PRN

## 2020-09-12 MED ORDER — CHLORHEXIDINE GLUCONATE 0.12 % MT SOLN: 15.0000 mL | Freq: Two times a day (BID) | OROMUCOSAL | 0 refills | Status: AC

## 2020-09-12 MED ORDER — AMOXICILLIN-POT CLAVULANATE 250-62.5 MG/5ML PO SUSR: 500.0000 mg | Freq: Two times a day (BID) | ORAL | 0 refills | Status: AC
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
## 2020-09-12 NOTE — Plan of Care (Signed)
  Problem: Education: Goal: Knowledge of General Education information will improve Description: Including pain rating scale, medication(s)/side effects and non-pharmacologic comfort measures Outcome: Adequate for Discharge   
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
## 2020-09-12 NOTE — Progress Notes (Signed)
Discharge instructions (including medications) discussed with and copy provided to patient/caregiver 

## 2020-09-13 ENCOUNTER — Other Ambulatory Visit (HOSPITAL_COMMUNITY): Payer: Self-pay

## 2020-09-13 MED ORDER — OXYCODONE HCL 5 MG/5ML PO SOLN
ORAL | 0 refills | Status: AC
  Filled 2020-09-13: qty 100, 5d supply, fill #0

## 2020-10-03 ENCOUNTER — Encounter (HOSPITAL_COMMUNITY): Payer: Self-pay | Admitting: Oral Surgery

## 2020-10-03 NOTE — Addendum Note (Signed)
Addendum  created 10/03/20 1301 by Leonides Grills, MD   Intraprocedure Event edited, Intraprocedure Staff edited

## 2021-04-17 ENCOUNTER — Other Ambulatory Visit: Payer: Self-pay

## 2021-12-27 ENCOUNTER — Other Ambulatory Visit: Payer: Self-pay
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
